# Patient Record
Sex: Male | Born: 1990 | Race: White | Hispanic: No | Marital: Single | Smoking: Never smoker
Health system: Southern US, Community
[De-identification: ages and names within clinical notes are randomized; demographics above are authoritative.]

---

## 2011-06-17 ENCOUNTER — Emergency Department (HOSPITAL_COMMUNITY): Payer: No Typology Code available for payment source

## 2011-06-17 ENCOUNTER — Encounter (HOSPITAL_COMMUNITY): Payer: Self-pay

## 2011-06-17 ENCOUNTER — Emergency Department (HOSPITAL_COMMUNITY)
Admission: EM | Admit: 2011-06-17 | Discharge: 2011-06-17 | Disposition: A | Discharge status: Home / Self Care | Payer: No Typology Code available for payment source | Attending: Emergency Medicine | Admitting: Emergency Medicine

## 2011-06-17 DIAGNOSIS — R0989 Other specified symptoms and signs involving the circulatory and respiratory systems: Secondary | ICD-10-CM | POA: Insufficient documentation

## 2011-06-17 DIAGNOSIS — R0609 Other forms of dyspnea: Secondary | ICD-10-CM | POA: Insufficient documentation

## 2011-06-17 DIAGNOSIS — S139XXA Sprain of joints and ligaments of unspecified parts of neck, initial encounter: Secondary | ICD-10-CM | POA: Insufficient documentation

## 2011-06-17 DIAGNOSIS — M25519 Pain in unspecified shoulder: Secondary | ICD-10-CM | POA: Insufficient documentation

## 2011-06-17 DIAGNOSIS — R1011 Right upper quadrant pain: Secondary | ICD-10-CM | POA: Insufficient documentation

## 2011-06-17 DIAGNOSIS — S51809A Unspecified open wound of unspecified forearm, initial encounter: Secondary | ICD-10-CM | POA: Insufficient documentation

## 2011-06-17 DIAGNOSIS — S20219A Contusion of unspecified front wall of thorax, initial encounter: Secondary | ICD-10-CM | POA: Insufficient documentation

## 2011-06-17 DIAGNOSIS — R071 Chest pain on breathing: Secondary | ICD-10-CM | POA: Insufficient documentation

## 2011-06-17 DIAGNOSIS — R300 Dysuria: Secondary | ICD-10-CM | POA: Insufficient documentation

## 2011-06-17 DIAGNOSIS — M542 Cervicalgia: Secondary | ICD-10-CM | POA: Insufficient documentation

## 2011-06-17 LAB — POCT I-STAT, CHEM 8
BUN: 13 mg/dL (ref 6–23)
Calcium, Ion: 1.2 mmol/L (ref 1.12–1.32)
Chloride: 104 mEq/L (ref 96–112)
HCT: 47 % (ref 39.0–52.0)
Potassium: 3.4 mEq/L — ABNORMAL LOW (ref 3.5–5.1)
Sodium: 140 mEq/L (ref 135–145)

## 2011-06-17 LAB — CBC
HCT: 44 % (ref 39.0–52.0)
Hemoglobin: 15.7 g/dL (ref 13.0–17.0)
MCH: 30.7 pg (ref 26.0–34.0)
MCV: 86.1 fL (ref 78.0–100.0)
Platelets: 189 10*3/uL (ref 150–400)
RBC: 5.11 MIL/uL (ref 4.22–5.81)
WBC: 5.1 10*3/uL (ref 4.0–10.5)

## 2011-06-17 LAB — DIFFERENTIAL
Lymphocytes Relative: 34 % (ref 12–46)
Lymphs Abs: 1.7 10*3/uL (ref 0.7–4.0)
Monocytes Relative: 6 % (ref 3–12)
Neutro Abs: 2.9 10*3/uL (ref 1.7–7.7)
Neutrophils Relative %: 58 % (ref 43–77)

## 2011-06-17 MED ORDER — IOHEXOL 300 MG/ML  SOLN
100.0000 mL | Freq: Once | INTRAMUSCULAR | Status: AC | PRN
  Administered 2011-06-17: 100 mL via INTRAVENOUS
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
## 2021-12-09 ENCOUNTER — Emergency Department (HOSPITAL_COMMUNITY)
Admission: EM | Admit: 2021-12-09 | Discharge: 2021-12-10 | Disposition: A | Discharge status: Home / Self Care | Payer: Self-pay | Attending: Emergency Medicine | Admitting: Emergency Medicine

## 2021-12-09 ENCOUNTER — Other Ambulatory Visit: Payer: Self-pay

## 2021-12-09 DIAGNOSIS — K8020 Calculus of gallbladder without cholecystitis without obstruction: Secondary | ICD-10-CM

## 2021-12-09 LAB — COMPREHENSIVE METABOLIC PANEL
ALT: 38 U/L (ref 0–44)
AST: 25 U/L (ref 15–41)
Albumin: 4.4 g/dL (ref 3.5–5.0)
Alkaline Phosphatase: 90 U/L (ref 38–126)
Anion gap: 11 (ref 5–15)
BUN: 12 mg/dL (ref 6–20)
CO2: 24 mmol/L (ref 22–32)
Calcium: 9.2 mg/dL (ref 8.9–10.3)
Chloride: 100 mmol/L (ref 98–111)
Creatinine, Ser: 1.06 mg/dL (ref 0.61–1.24)
GFR, Estimated: >60 mL/min (ref >60)
Glucose, Bld: 123 mg/dL — ABNORMAL HIGH (ref 70–99)
Potassium: 3.9 mmol/L (ref 3.5–5.1)
Sodium: 135 mmol/L (ref 135–145)
Total Bilirubin: 1.1 mg/dL (ref 0.3–1.2)
Total Protein: 7.5 g/dL (ref 6.5–8.1)

## 2021-12-09 LAB — CBC
HCT: 45.1 % (ref 39.0–52.0)
Hemoglobin: 16 g/dL (ref 13.0–17.0)
MCH: 30.3 pg (ref 26.0–34.0)
MCHC: 35.5 g/dL (ref 30.0–36.0)
MCV: 85.4 fL (ref 80.0–100.0)
Platelets: 268 10*3/uL (ref 150–400)
RBC: 5.28 MIL/uL (ref 4.22–5.81)
RDW: 11.8 % (ref 11.5–15.5)
WBC: 9.5 10*3/uL (ref 4.0–10.5)
nRBC: 0 % (ref 0.0–0.2)

## 2021-12-09 LAB — LIPASE, BLOOD: Lipase: 31 U/L (ref 11–51)

## 2021-12-09 NOTE — ED Triage Notes (Signed)
Pt c/o upper abd pain that started around 0300 this morning. Pt denies nausea or vomiting.

## 2021-12-10 ENCOUNTER — Emergency Department (HOSPITAL_COMMUNITY): Payer: Self-pay

## 2021-12-10 ENCOUNTER — Telehealth (HOSPITAL_COMMUNITY): Payer: Self-pay | Admitting: Emergency Medicine

## 2021-12-10 LAB — URINALYSIS, MICROSCOPIC (REFLEX)

## 2021-12-10 LAB — URINALYSIS, ROUTINE W REFLEX MICROSCOPIC
Bilirubin Urine: NEGATIVE
Glucose, UA: NEGATIVE mg/dL
Hgb urine dipstick: NEGATIVE
Ketones, ur: NEGATIVE mg/dL
Leukocytes,Ua: NEGATIVE
Nitrite: NEGATIVE
Protein, ur: 30 mg/dL — AB
Specific Gravity, Urine: >1.03 — ABNORMAL HIGH (ref 1.005–1.030)
pH: 6 (ref 5.0–8.0)

## 2021-12-10 MED ORDER — OXYCODONE-ACETAMINOPHEN 5-325 MG PO TABS: 1.0000 | ORAL_TABLET | Freq: Four times a day (QID) | ORAL | 0 refills | Status: DC | PRN

## 2021-12-10 MED ORDER — ONDANSETRON 4 MG PO TBDP: 4.0000 mg | ORAL_TABLET | Freq: Three times a day (TID) | ORAL | 0 refills | Status: DC | PRN

## 2021-12-10 MED ORDER — FENTANYL CITRATE PF 50 MCG/ML IJ SOSY
50.0000 ug | PREFILLED_SYRINGE | Freq: Once | INTRAMUSCULAR | Status: DC
  Filled 2021-12-10: qty 1

## 2021-12-10 MED ORDER — HYDROCODONE-ACETAMINOPHEN 5-325 MG PO TABS: 1.0000 | ORAL_TABLET | Freq: Four times a day (QID) | ORAL | 0 refills | Status: DC | PRN

## 2021-12-10 MED ORDER — HYDROCODONE-ACETAMINOPHEN 5-325 MG PO TABS
2.0000 | ORAL_TABLET | Freq: Once | ORAL | Status: AC
  Administered 2021-12-10: 2 via ORAL
  Filled 2021-12-10: qty 2

## 2021-12-10 NOTE — Telephone Encounter (Signed)
Pharmacy called and he is out of Vicodin.  Patient has biliary colic.  Prescribed Percocet to [facility] in [address], [place]

## 2021-12-10 NOTE — Discharge Instructions (Signed)
Please schedule an appointment with the surgery group listed.  Return for new or worsening symptoms.

## 2021-12-10 NOTE — ED Provider Notes (Signed)
[facility] Emergency Department Provider Note MRN:  [id number]  Arrival date & time: 12/10/21     Chief Complaint   Abdominal Pain   History of Present Illness   Dakota Martinez is a 31 y.o. year-old male presents to the ED with chief complaint of right upper quadrant abdominal pain that started yesterday morning at 3 AM.  He reports that it has been persistent throughout the day today.  He states that it does wax and wane in severity.  He tried taking ibuprofen with some improvement of the pain.  Denies any fever, chills, nausea, vomiting.  Denies any dysuria or flank pain.  Denies any prior abdominal surgeries.  History provided by patient.  Review of Systems  Pertinent review of systems noted in HPI.    Physical Exam   Vitals:   12/09/21 2317 12/10/21 0030  BP: (!) 171/116 (!) 167/107  Pulse: (!) 113 95  Resp: 16 (!) 24  Temp: 98.9 F (37.2 C)   SpO2: 97% 96%    CONSTITUTIONAL: Well-appearing, NAD NEURO:  Alert and oriented x 3, CN 3-12 grossly intact EYES:  eyes equal and reactive ENT/NECK:  Supple, no stridor  CARDIO: Normal rate, appears well-perfused  PULM:  No respiratory distress,  GI/GU:  non-distended, right upper abdominal tenderness, no other focal abdominal tenderness MSK/SPINE:  No gross deformities, no edema, moves all extremities  SKIN:  no rash, atraumatic   *Additional and/or pertinent findings included in MDM below  Diagnostic and Interventional Summary    EKG Interpretation  Date/Time:    Ventricular Rate:    PR Interval:    QRS Duration:   QT Interval:    QTC Calculation:   R Axis:     Text Interpretation:         Labs Reviewed  COMPREHENSIVE METABOLIC PANEL - Abnormal; Notable for the following components:      Result Value   Glucose, Bld 123 (*)    All other components within normal limits  URINALYSIS, ROUTINE W REFLEX MICROSCOPIC - Abnormal; Notable for the following components:   Specific Gravity,  Urine >1.030 (*)    Protein, ur 30 (*)    All other components within normal limits  URINALYSIS, MICROSCOPIC (REFLEX) - Abnormal; Notable for the following components:   Bacteria, UA RARE (*)    All other components within normal limits  LIPASE, BLOOD  CBC    No orders to display    Medications  fentaNYL (SUBLIMAZE) injection 50 mcg (has no administration in time range)     Procedures  /  Critical Care Procedures  ED Course and Medical Decision Making  I have reviewed the triage vital signs, the nursing notes, and pertinent available records from the EMR.  Complexity of Problems Addressed: High Complexity: Acute illness/injury posing a threat to life or bodily function, requiring emergent diagnostic workup, evaluation, and treatment as below.  ED Course:    After considering the following differential, cholecystitis, pancreatitis, appendicitis, kidney stone, I ordered right upper quadrant ultrasound, and agree with orders for basic labs performed in triage.  I personally interpreted the labs which are notable for normal LFTs and normal lipase, normal total bilirubin I visualized the [place] which is notable for gallstones, but without gallbladder wall thickening or evidence of cholecystitis and agree with radiologist interpretation.     Social Determinants Affecting Care:     Consultants: No consultations were needed in caring for this patient.   Treatment and Plan:  Patient here with  right upper abdominal pain.  Ultrasound notable for gallstones.  No sign of cholecystitis.  Vital signs and labs are reassuring.  Will discharge home with general surgery follow-up.  We will treat pain with Vicodin and nausea with Zofran.  Return precautions discussed.  Patient is stable ready for discharge.  I considered admission due to patient's initial presentation, but after considering the examination and diagnostic results, patient will not require admission and can be discharged with  outpatient follow-up.    Final Clinical Impressions(s) / ED Diagnoses     ICD-10-CM   1. Calculus of gallbladder without cholecystitis without obstruction  K80.20       ED Discharge Orders     None        Discharge Instructions Discussed with and Provided to Patient:   Discharge Instructions   None      Montine Circle, PA-C 12/10/21 0216    Fatima Blank, MD 12/10/21 [phone]

## 2021-12-10 NOTE — ED Notes (Signed)
Patient verbalizes understanding of d/c instructions. Opportunities for questions and answers were provided. Pt d/c from ED and ambulated to lobby with wife.  

## 2021-12-27 ENCOUNTER — Emergency Department (HOSPITAL_COMMUNITY)
Admission: EM | Admit: 2021-12-27 | Discharge: 2021-12-27 | Disposition: A | Discharge status: Home / Self Care | Payer: Self-pay | Attending: Emergency Medicine | Admitting: Emergency Medicine

## 2021-12-27 ENCOUNTER — Other Ambulatory Visit: Payer: Self-pay

## 2021-12-27 ENCOUNTER — Encounter (HOSPITAL_COMMUNITY): Payer: Self-pay

## 2021-12-27 ENCOUNTER — Emergency Department (HOSPITAL_COMMUNITY): Payer: Self-pay

## 2021-12-27 DIAGNOSIS — K8020 Calculus of gallbladder without cholecystitis without obstruction: Secondary | ICD-10-CM | POA: Insufficient documentation

## 2021-12-27 DIAGNOSIS — R7401 Elevation of levels of liver transaminase levels: Secondary | ICD-10-CM | POA: Insufficient documentation

## 2021-12-27 DIAGNOSIS — Z20822 Contact with and (suspected) exposure to covid-19: Secondary | ICD-10-CM | POA: Insufficient documentation

## 2021-12-27 LAB — CBC
HCT: 43.5 % (ref 39.0–52.0)
Hemoglobin: 15.4 g/dL (ref 13.0–17.0)
MCH: 30 pg (ref 26.0–34.0)
MCHC: 35.4 g/dL (ref 30.0–36.0)
MCV: 84.8 fL (ref 80.0–100.0)
Platelets: 249 10*3/uL (ref 150–400)
RBC: 5.13 MIL/uL (ref 4.22–5.81)
RDW: 11.8 % (ref 11.5–15.5)
WBC: 6 10*3/uL (ref 4.0–10.5)
nRBC: 0 % (ref 0.0–0.2)

## 2021-12-27 LAB — COMPREHENSIVE METABOLIC PANEL
ALT: 52 U/L — ABNORMAL HIGH (ref 0–44)
AST: 70 U/L — ABNORMAL HIGH (ref 15–41)
Albumin: 4.4 g/dL (ref 3.5–5.0)
Alkaline Phosphatase: 87 U/L (ref 38–126)
Anion gap: 11 (ref 5–15)
BUN: 15 mg/dL (ref 6–20)
CO2: 28 mmol/L (ref 22–32)
Calcium: 9.9 mg/dL (ref 8.9–10.3)
Chloride: 100 mmol/L (ref 98–111)
Creatinine, Ser: 1.22 mg/dL (ref 0.61–1.24)
GFR, Estimated: >60 mL/min (ref >60)
Glucose, Bld: 111 mg/dL — ABNORMAL HIGH (ref 70–99)
Potassium: 3.5 mmol/L (ref 3.5–5.1)
Sodium: 139 mmol/L (ref 135–145)
Total Bilirubin: 0.7 mg/dL (ref 0.3–1.2)
Total Protein: 7.4 g/dL (ref 6.5–8.1)

## 2021-12-27 LAB — URINALYSIS, ROUTINE W REFLEX MICROSCOPIC
Bacteria, UA: NONE SEEN
Bilirubin Urine: NEGATIVE
Glucose, UA: NEGATIVE mg/dL
Hgb urine dipstick: NEGATIVE
Ketones, ur: NEGATIVE mg/dL
Leukocytes,Ua: NEGATIVE
Nitrite: NEGATIVE
Protein, ur: 30 mg/dL — AB
Specific Gravity, Urine: 1.019 (ref 1.005–1.030)
pH: 7 (ref 5.0–8.0)

## 2021-12-27 LAB — RESP PANEL BY RT-PCR (FLU A&B, COVID) ARPGX2
Influenza A by PCR: NEGATIVE
Influenza B by PCR: NEGATIVE
SARS Coronavirus 2 by RT PCR: NEGATIVE

## 2021-12-27 LAB — LIPASE, BLOOD: Lipase: 38 U/L (ref 11–51)

## 2021-12-27 MED ORDER — ONDANSETRON 4 MG PO TBDP: 4.0000 mg | ORAL_TABLET | Freq: Three times a day (TID) | ORAL | 0 refills | Status: DC | PRN

## 2021-12-27 MED ORDER — ONDANSETRON 4 MG PO TBDP: 4.0000 mg | ORAL_TABLET | Freq: Three times a day (TID) | ORAL | 0 refills | Status: AC | PRN

## 2021-12-27 MED ORDER — OXYCODONE-ACETAMINOPHEN 5-325 MG PO TABS: 1.0000 | ORAL_TABLET | ORAL | 0 refills | Status: AC | PRN

## 2021-12-27 MED ORDER — HYDROCODONE-ACETAMINOPHEN 5-325 MG PO TABS: 2.0000 | ORAL_TABLET | ORAL | 0 refills | Status: DC | PRN

## 2021-12-27 NOTE — ED Provider Notes (Signed)
?[facility] EMERGENCY DEPARTMENT ?Provider Note ? ? ?CSN: [id number] ?Arrival date & time: 12/27/21  0205 ? ?  ? ?History ? ?Chief Complaint  ?Patient presents with  ? Abdominal Pain  ? ? ?Kreed Kauffman is a 31 y.o. male. ? ?The history is provided by the patient and medical records.  ?Abdominal Pain ? ?31 year old male with history of gallstones, presenting to the ED with right upper quadrant abdominal pain.  Diagnosed with gallstones in the ED 12/09/2021.  He had follow-up with general surgery in clinic yesterday.  He was recommended for surgery but felt he could manage with diet.  States last night for dinner ate some tacos and began having pain again.  He took one of his Vicodin around 1 AM and pain is better controlled at this time.  He has not had any nausea or vomiting.  States now he realizes he cannot manage this on his own and is hopeful for surgery.  He denies any fever. ? ?Home Medications ?Prior to Admission medications   ?Medication Sig Start Date End Date Taking? Authorizing Provider  ?HYDROcodone-acetaminophen (NORCO/VICODIN) 5-325 MG tablet Take 1-2 tablets by mouth every 6 (six) hours as needed. 12/10/21   Roxy Horseman, PA-C  ?ibuprofen (ADVIL) 200 MG tablet Take 800 mg by mouth every 6 (six) hours as needed for headache or moderate pain.    [provider]  ?ondansetron (ZOFRAN-ODT) 4 MG disintegrating tablet Take 1 tablet (4 mg total) by mouth every 8 (eight) hours as needed for nausea or vomiting. 12/10/21   Roxy Horseman, PA-C  ?oxyCODONE-acetaminophen (PERCOCET/ROXICET) 5-325 MG tablet Take 1 tablet by mouth every 6 (six) hours as needed for severe pain. 12/10/21   Charlynne Pander, MD  ?   ? ?Allergies    ?Patient has no known allergies.   ? ?Review of Systems   ?Review of Systems  ?Gastrointestinal:  Positive for abdominal pain.  ?All other systems reviewed and are negative. ? ?Physical Exam ?Updated Vital Signs ?BP (!) 158/91 (BP Location: Right Arm)    Pulse 88   Temp 98 ?F (36.7 ?C) (Oral)   Resp 17   SpO2 95%  ? ?Physical Exam ?Vitals and nursing note reviewed.  ?Constitutional:   ?   Appearance: He is well-developed.  ?HENT:  ?   Head: Normocephalic and atraumatic.  ?Eyes:  ?   Conjunctiva/sclera: Conjunctivae normal.  ?   Pupils: Pupils are equal, round, and reactive to light.  ?Cardiovascular:  ?   Rate and Rhythm: Normal rate and regular rhythm.  ?   Heart sounds: Normal heart sounds.  ?Pulmonary:  ?   Effort: Pulmonary effort is normal.  ?   Breath sounds: Normal breath sounds.  ?Abdominal:  ?   General: Bowel sounds are normal.  ?   Palpations: Abdomen is soft.  ?   Tenderness: There is abdominal tenderness in the right upper quadrant.  ?   Comments: Minimal tenderness RUQ  ?Musculoskeletal:     ?   General: Normal range of motion.  ?   Cervical back: Normal range of motion.  ?Skin: ?   General: Skin is warm and dry.  ?Neurological:  ?   Mental Status: He is alert and oriented to person, place, and time.  ? ? ?ED Results / Procedures / Treatments   ?Labs ?(all labs ordered are listed, but only abnormal results are displayed) ?Labs Reviewed  ?COMPREHENSIVE METABOLIC PANEL - Abnormal; Notable for the following components:  ?  Result Value  ? Glucose, Bld 111 (*)   ? AST 70 (*)   ? ALT 52 (*)   ? All other components within normal limits  ?URINALYSIS, ROUTINE W REFLEX MICROSCOPIC - Abnormal; Notable for the following components:  ? Protein, ur 30 (*)   ? All other components within normal limits  ?RESP PANEL BY RT-PCR (FLU A&B, COVID) [id number]  ?LIPASE, BLOOD  ?CBC  ? ? ?EKG ?None ? ?Radiology ?US Abdomen Limited RUQ (LIVER/GB) ? ?Result Date: 12/27/2021 ?CLINICAL DATA:  Follow-up cholelithiasis EXAM: ULTRASOUND ABDOMEN LIMITED RIGHT UPPER QUADRANT COMPARISON:  12/10/2021 FINDINGS: Gallbladder: Gallbladder is decompressed with multiple gallstones within. No pericholecystic fluid is noted. Negative sonographic Murphy's sign is elicited. Common bile duct:  Diameter: 3.5 mm Liver: Diffuse increased echogenicity is noted consistent with fatty infiltration. No focal mass is noted. Portal vein is patent on color Doppler imaging with normal direction of blood flow towards the liver. Other: None. IMPRESSION: Cholelithiasis without complicating factors. Fatty liver. Electronically Signed   By: Alcide Clever M.D.   On: 12/27/2021 03:22   ? ?Procedures ?Procedures  ? ? ?Medications Ordered in ED ?Medications - No data to display ? ?ED Course/ Medical Decision Making/ A&P ?  ?                        ?Medical Decision Making ?Amount and/or Complexity of Data Reviewed ?Labs: ordered. ?Radiology: ordered and independent interpretation performed. ?ECG/medicine tests: ordered and independent interpretation performed. ? ?Risk ?Prescription drug management. ? ? ?31 year old male presenting to the ED with right upper quadrant pain.  Diagnosed with gallstones last month, had follow-up in general surgery clinic yesterday was recommended for cholecystectomy.  Thought he could manage symptoms on his own, however after eating tacos had recurrent pain.  Took oxycodone prior to arrival and now pain is resolved.  He has minimal tenderness to the right upper quadrant without Murphy sign.  No guarding.  We will repeat labs and ultrasound. ? ?Labs overall reassuring.  Does have very mild elevation in LFTs but normal bilirubin and lipase.  Ultrasound with findings of gallstones but no gallbladder wall thickening or pericholecystic fluid.   ? ?3:47 AM ?Discussed with general surgery, Dr. Cliffton Asters-- reviewed labs and imaging studies from this visit.  Feels that without findings of cholecystitis, leukocytosis or intractable pain, ok to call general surgery office in the morning to get arranged for elective cholecystectomy.   ? ?Patient reassessed-- pain remains controlled.  No vomiting here.  VSS.  He is comfortable with plan to d/c and call general surgery office in the AM.  Will refill pain meds to use  if needed.  Can return here for any new/acute changes. ? ?Final Clinical Impression(s) / ED Diagnoses ?Final diagnoses:  ?Gallstones  ? ? ?Rx / DC Orders ?ED Discharge Orders   ? ?      Ordered  ?   12/27/21 0352  ?   12/27/21 0352  ?  oxyCODONE-acetaminophen (PERCOCET) 5-325 MG tablet  Every 4 hours PRN       ? 12/27/21 0359  ?  ondansetron (ZOFRAN-ODT) 4 MG disintegrating tablet  Every 8 hours PRN       ? 12/27/21 0359  ? ?  ?  ? ?  ? ? ?  ?Garlon Hatchet, PA-C ?12/27/21 2992 ? ?  ?Marily Memos, MD ?12/27/21 4268 ? ?

## 2021-12-27 NOTE — ED Triage Notes (Addendum)
Pt reports he was seen here on 2/19 for gallbladder problems. Pt reports that he was referred to general surgery.Pt saw them in office yesterday. Pt reports they recommended surgery and pt denied it at the time. Pt reports today the pain is worse and he is willing to do the surgery. ?

## 2021-12-27 NOTE — Discharge Instructions (Signed)
Take the prescribed medication as directed.   ?Call general surgery office in the morning to get surgery scheduled. ?Return to the ED for new or worsening symptoms-- uncontrolled pain, fever, vomiting, etc. ?

## 2023-03-12 IMAGING — US US ABDOMEN LIMITED
1 series · 14 of 25 positions shown · non-contrast
Comparison: None.

CLINICAL DATA: Right upper quadrant pain.

EXAM:
ULTRASOUND ABDOMEN LIMITED RIGHT UPPER QUADRANT

[Series 1: us abdomen limited · 36 acquisitions, 14 frames shown]
[im 1/36]
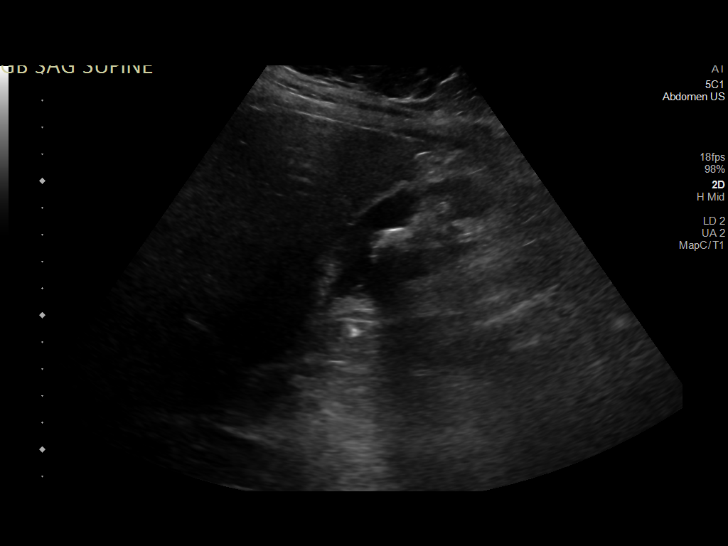
[im 3/36]
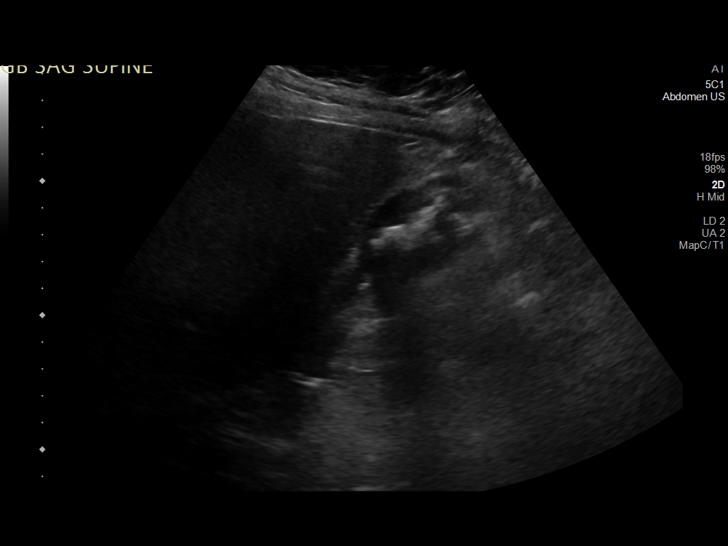
[im 6/36]
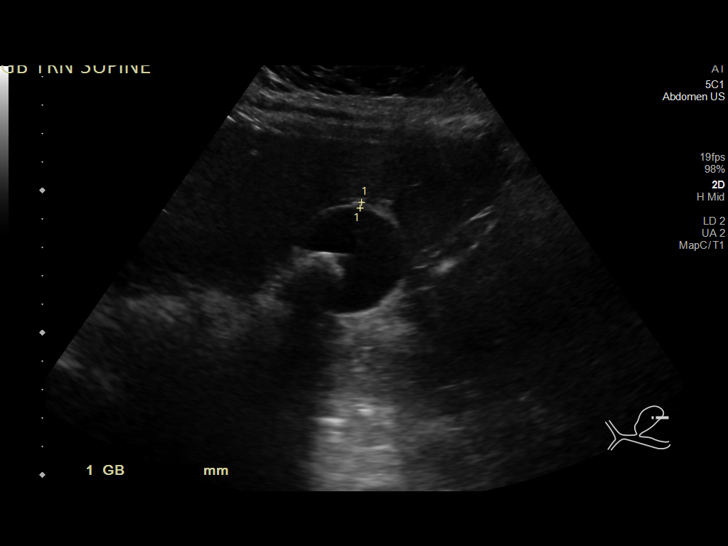
[im 9/36]
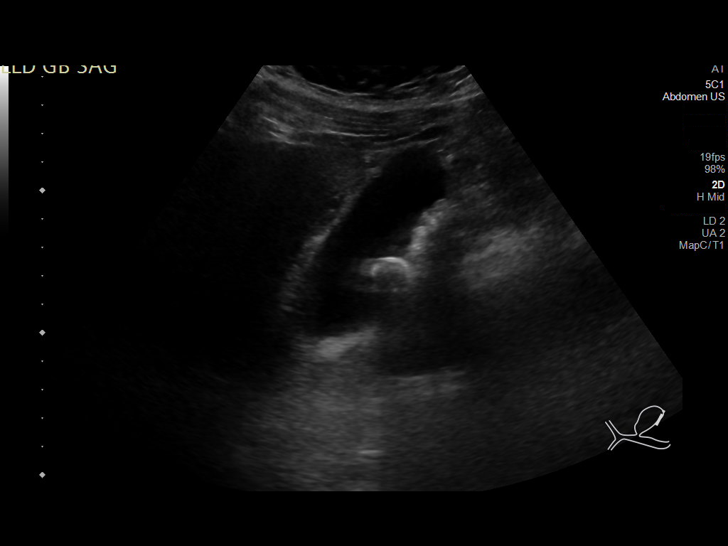
[im 12/36]
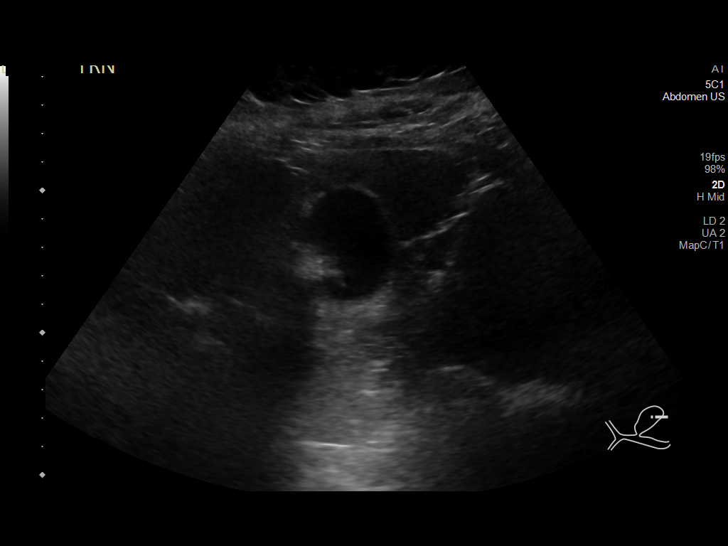
[im 14/36]
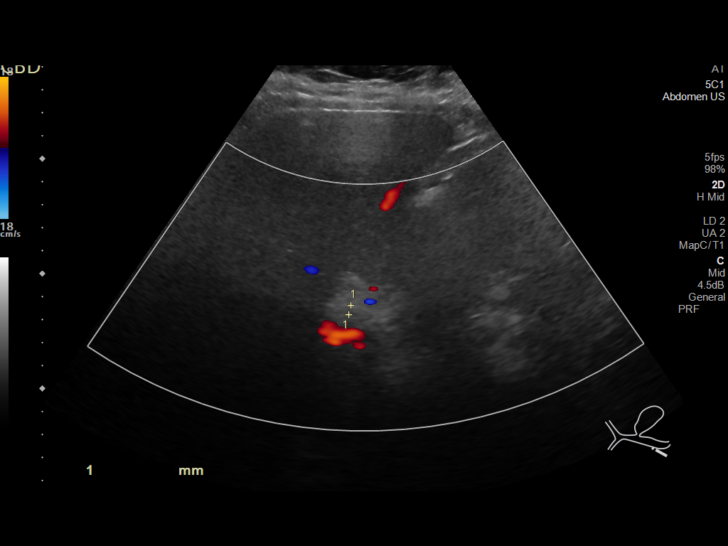
[im 17/36]
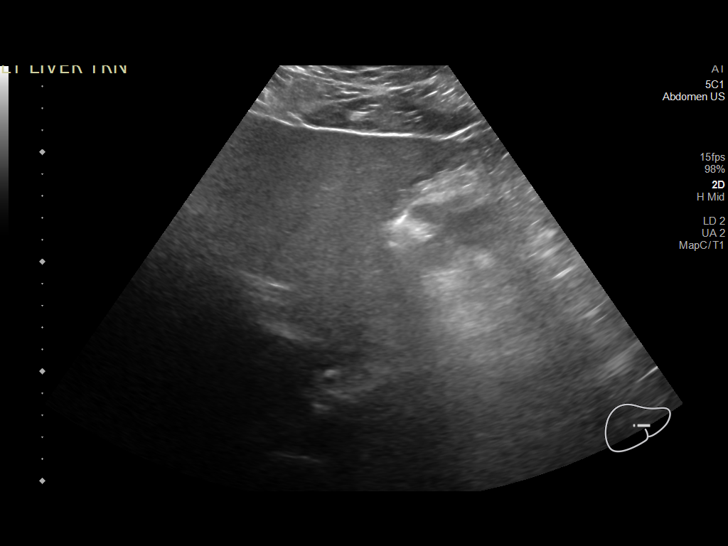
[im 19/36]
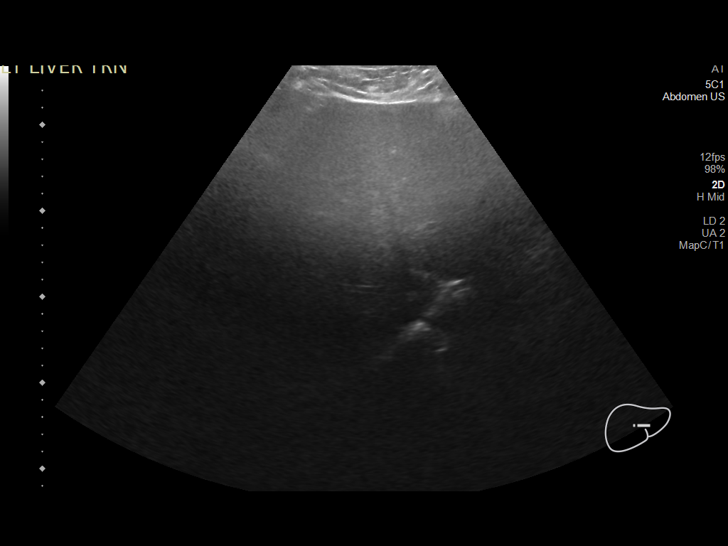
[im 22/36]
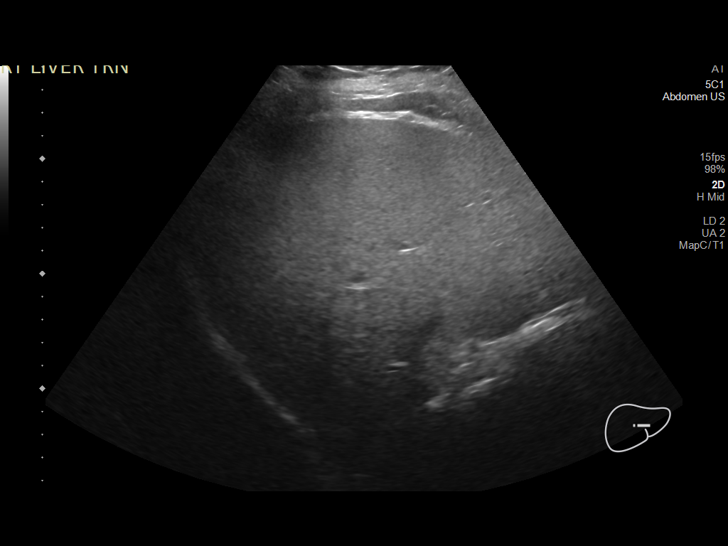
[im 24/36]
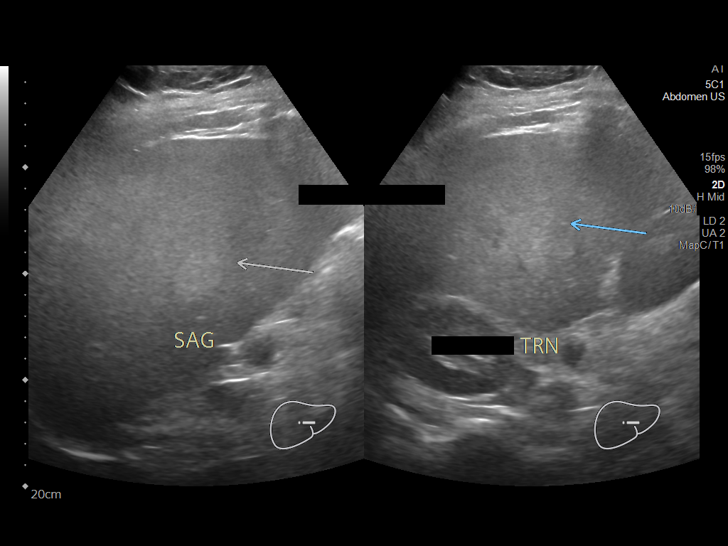
[im 27/36]
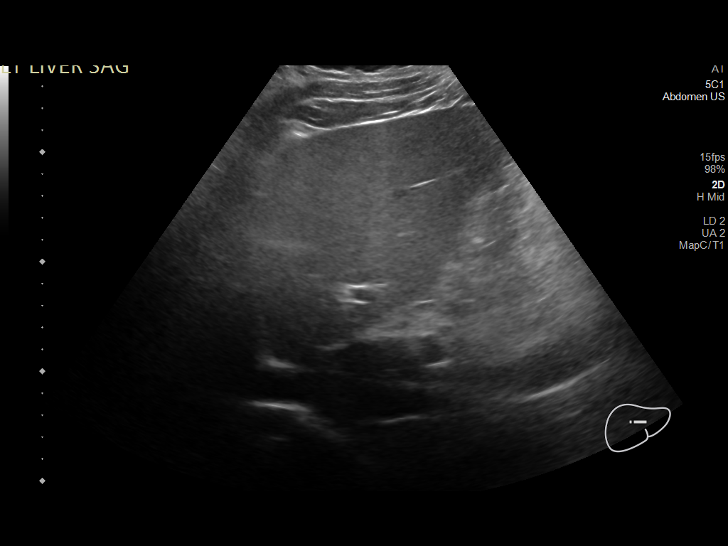
[im 30/36]
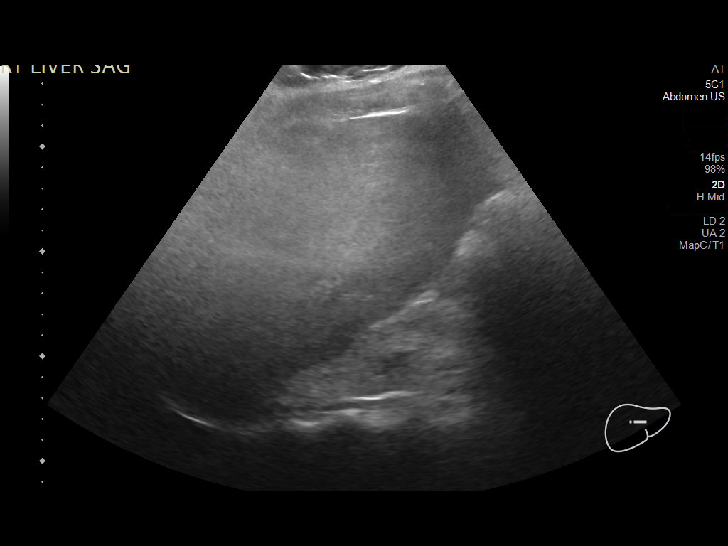
[im 33/36]
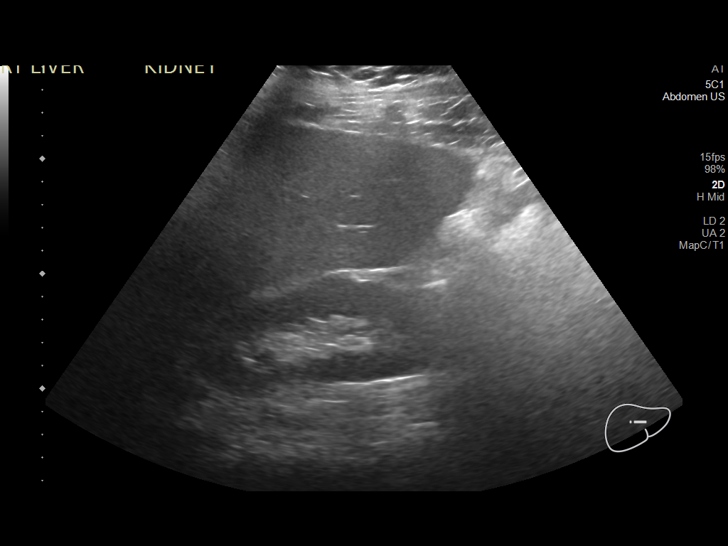
[im 36/36]
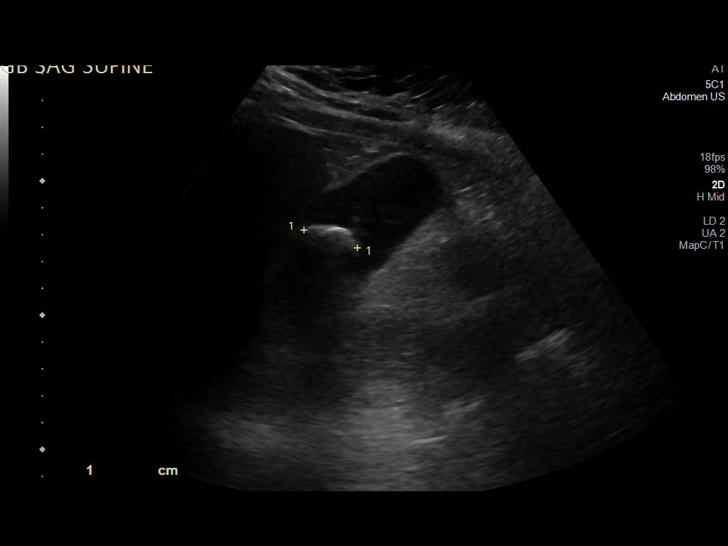

[14 of 25 positions shown; findings below may reference images not displayed]

FINDINGS: Gallbladder:

Shadowing echogenic gallstones are seen within the gallbladder
lumen. The largest measures approximately 2.1 cm. There is no
evidence of gallbladder wall thickening (2.1 mm). No sonographic
Murphy sign noted by sonographer.

Common bile duct:

Diameter: 5.9 mm

Liver:

No focal lesion identified. Diffusely increased echogenicity of the
liver parenchyma is noted. Portal vein is patent on color Doppler
imaging with normal direction of blood flow towards the liver.

Other: None.
IMPRESSION: 1. Cholelithiasis, without acute cholecystitis.
2. Hepatic steatosis without focal liver lesions.

## 2023-03-29 IMAGING — US US ABDOMEN LIMITED
1 series · 14 of 25 positions shown · non-contrast
Comparison: 12/10/2021

CLINICAL DATA: Follow-up cholelithiasis

EXAM:
ULTRASOUND ABDOMEN LIMITED RIGHT UPPER QUADRANT

[Series 1: us abdomen limited ruq (liver/gb) · 14 of 53 slices shown]
[im 1/53]
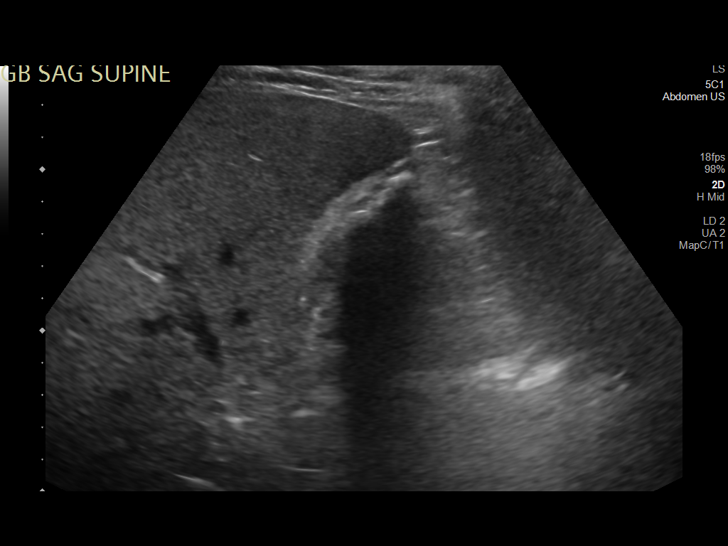
[im 5/53]
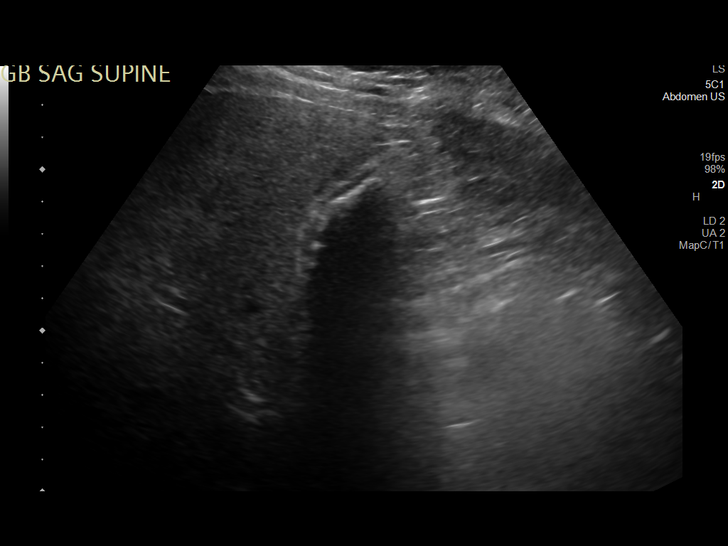
[im 9/53]
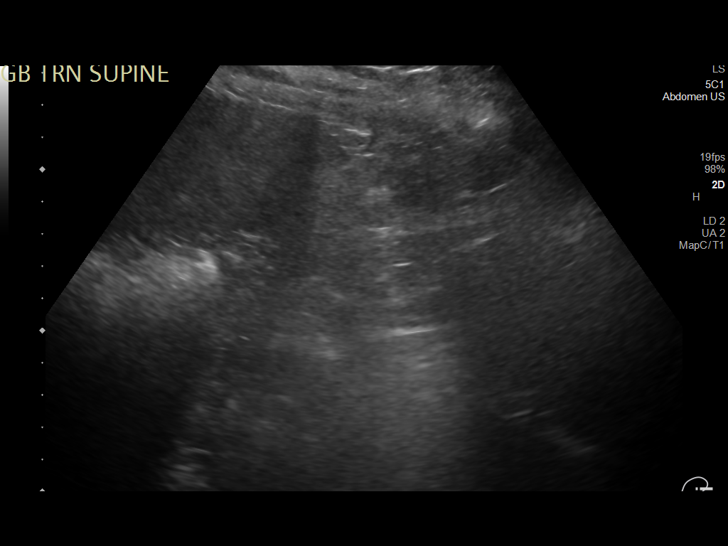
[im 14/53]
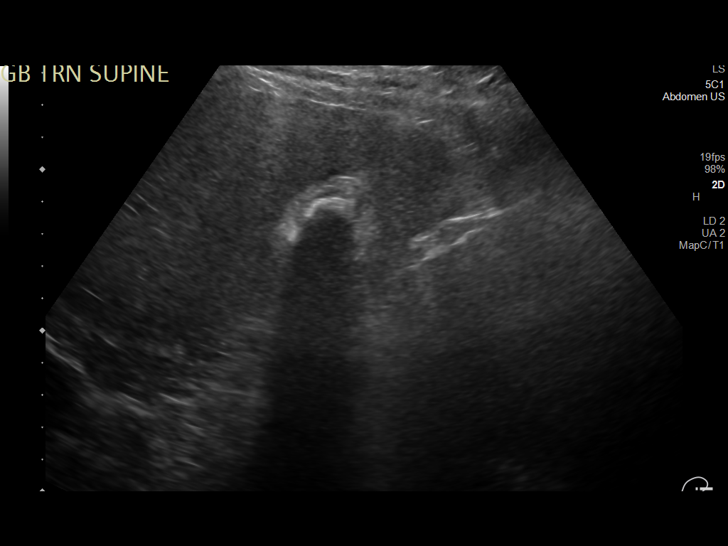
[im 18/53]
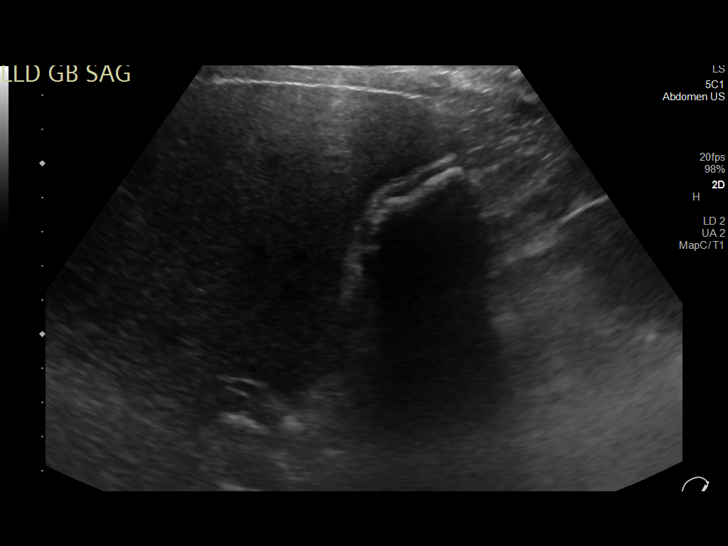
[im 20/53]
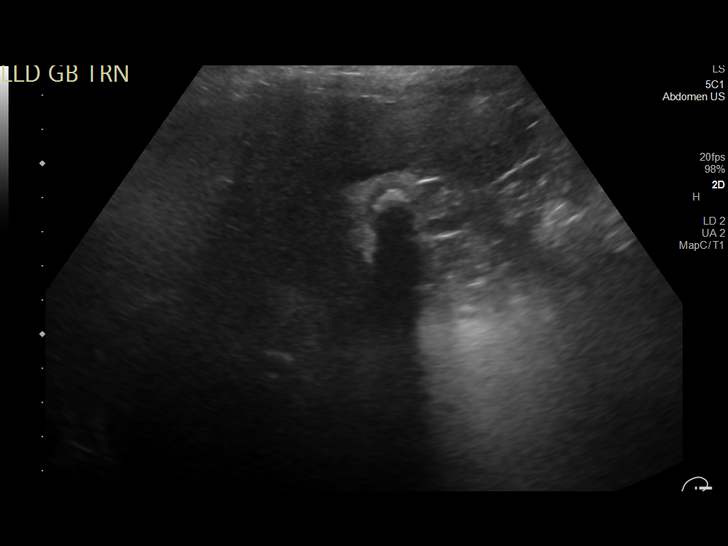
[im 24/53]
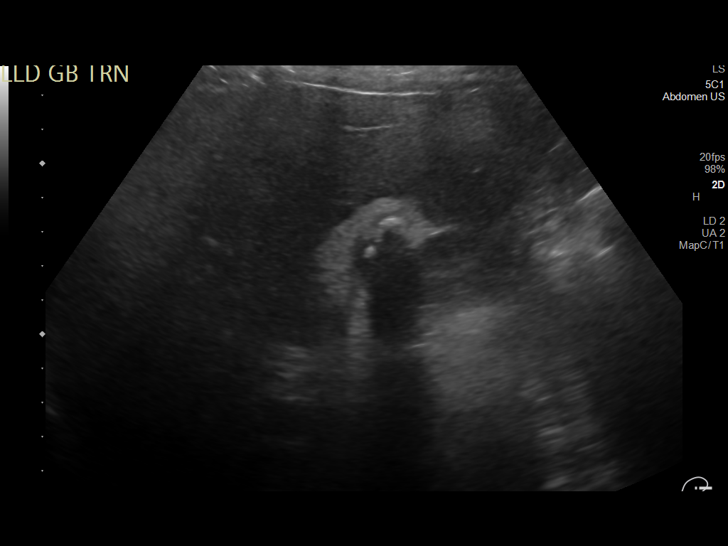
[im 29/53]
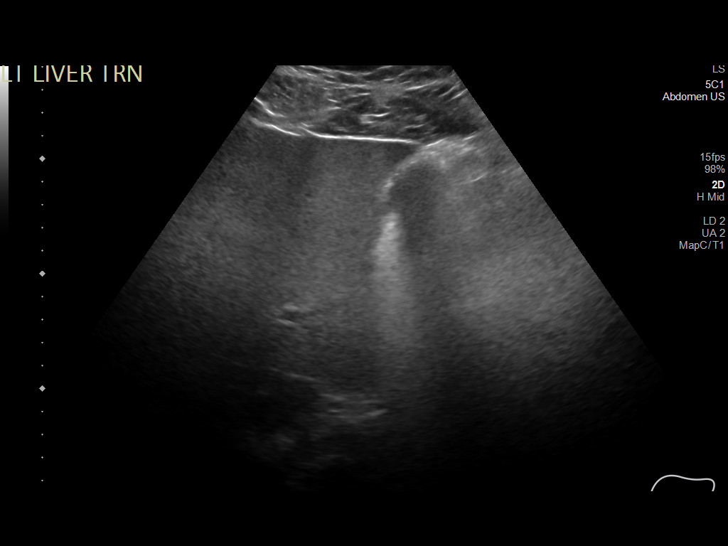
[im 33/53]
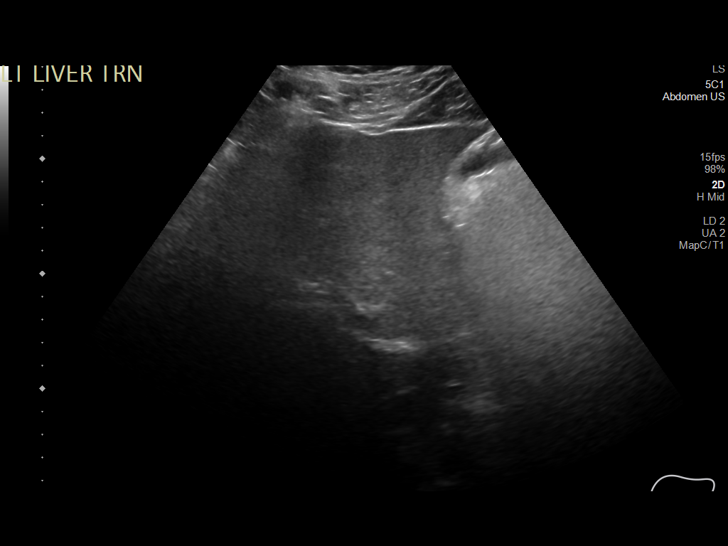
[im 35/53]
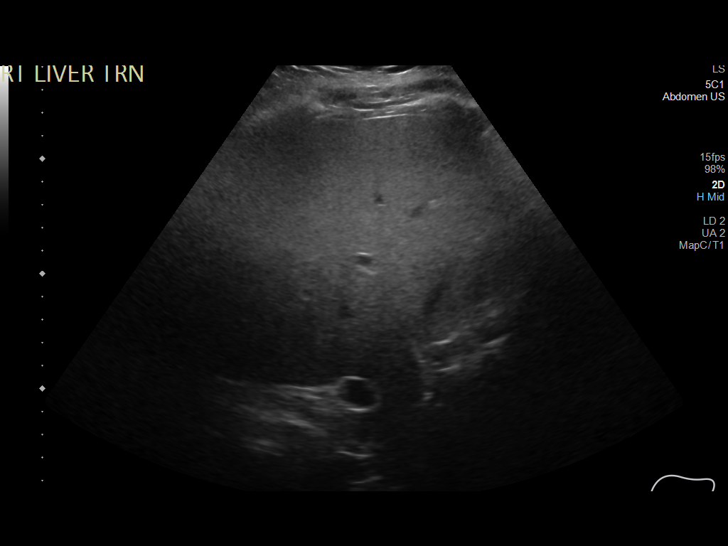
[im 40/53]
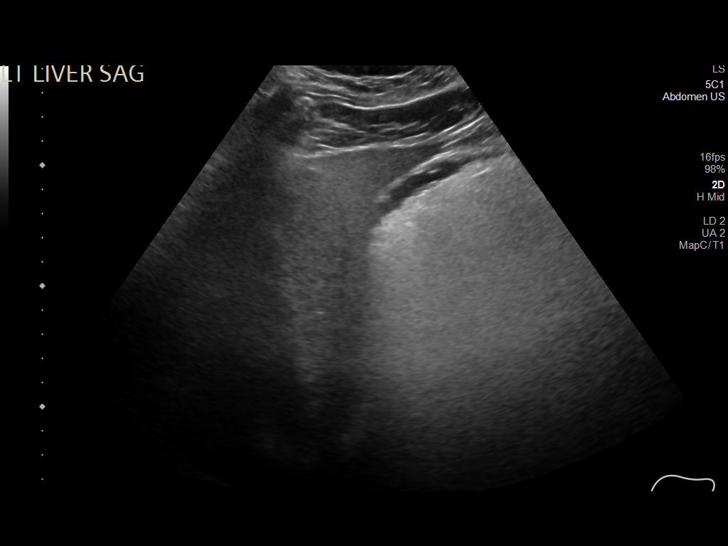
[im 44/53]
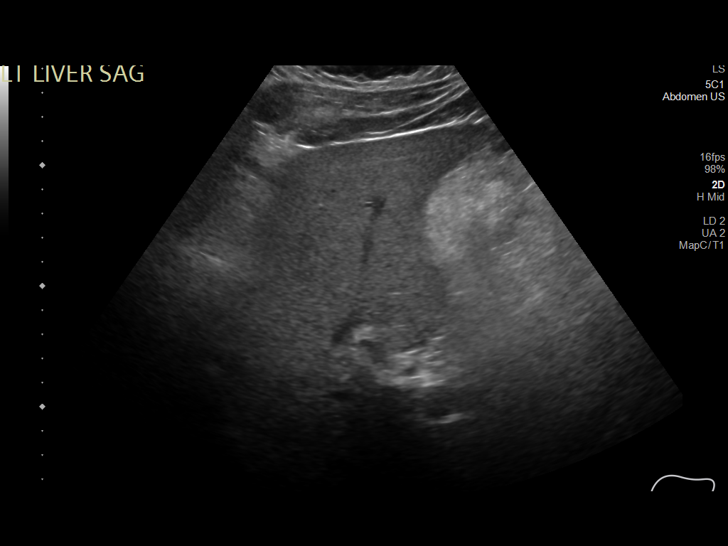
[im 48/53]
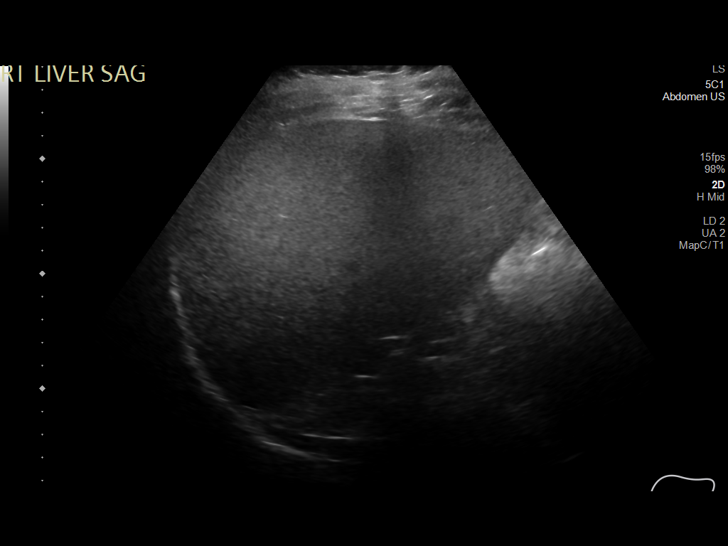
[im 53/53]
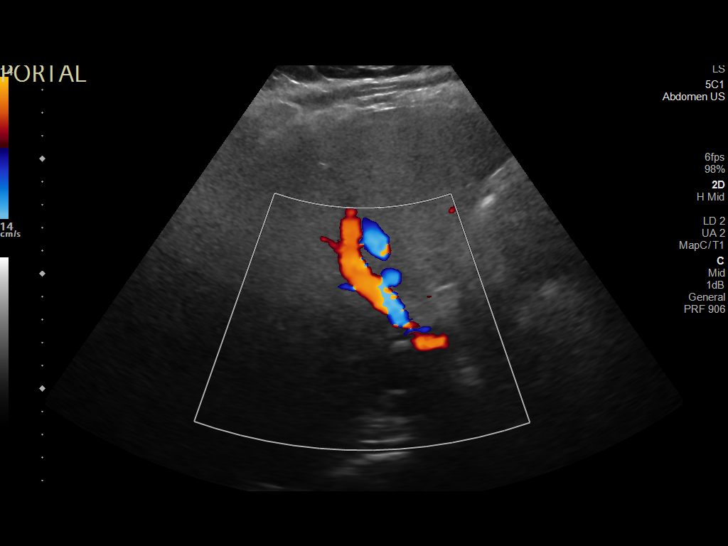

[14 of 25 positions shown; findings below may reference images not displayed]

FINDINGS: Gallbladder:

Gallbladder is decompressed with multiple gallstones within. No
pericholecystic fluid is noted. Negative sonographic Murphy's sign
is elicited.

Common bile duct:

Diameter: 3.5 mm

Liver:

Diffuse increased echogenicity is noted consistent with fatty
infiltration. No focal mass is noted. Portal vein is patent on color
Doppler imaging with normal direction of blood flow towards the
liver.

Other: None.
IMPRESSION: Cholelithiasis without complicating factors.

Fatty liver.
